# Patient Record
Sex: Female | Born: 1980 | Race: Black or African American | Hispanic: No | Marital: Married | Smoking: Current every day smoker
Health system: Southern US, Community
[De-identification: ages and names within clinical notes are randomized; demographics above are authoritative.]

## PROBLEM LIST (undated history)

## (undated) DIAGNOSIS — J40 Bronchitis, not specified as acute or chronic: Secondary | ICD-10-CM

## (undated) DIAGNOSIS — J45909 Unspecified asthma, uncomplicated: Secondary | ICD-10-CM

---

## 2003-01-27 ENCOUNTER — Emergency Department (HOSPITAL_COMMUNITY): Admission: EM | Admit: 2003-01-27 | Discharge: 2003-01-27 | Payer: Self-pay | Admitting: Emergency Medicine

## 2006-12-11 ENCOUNTER — Emergency Department (HOSPITAL_COMMUNITY): Admission: EM | Admit: 2006-12-11 | Discharge: 2006-12-11 | Payer: Self-pay | Admitting: Emergency Medicine

## 2007-06-15 ENCOUNTER — Emergency Department (HOSPITAL_COMMUNITY): Admission: EM | Admit: 2007-06-15 | Discharge: 2007-06-15 | Payer: Self-pay | Admitting: Emergency Medicine

## 2010-06-21 ENCOUNTER — Emergency Department (HOSPITAL_COMMUNITY): Payer: BC Managed Care – PPO

## 2010-06-21 ENCOUNTER — Emergency Department (HOSPITAL_COMMUNITY)
Admission: EM | Admit: 2010-06-21 | Discharge: 2010-06-21 | Disposition: A | Discharge status: Home / Self Care | Payer: BC Managed Care – PPO | Attending: Emergency Medicine | Admitting: Emergency Medicine

## 2010-06-21 DIAGNOSIS — R112 Nausea with vomiting, unspecified: Secondary | ICD-10-CM | POA: Insufficient documentation

## 2010-06-21 LAB — URINALYSIS, ROUTINE W REFLEX MICROSCOPIC
Glucose, UA: NEGATIVE mg/dL
Ketones, ur: 15 mg/dL — AB
Nitrite: NEGATIVE
Specific Gravity, Urine: 1.022 (ref 1.005–1.030)
pH: 7.5 (ref 5.0–8.0)

## 2010-06-21 LAB — POCT I-STAT, CHEM 8
BUN: 4 mg/dL — ABNORMAL LOW (ref 6–23)
Chloride: 104 mEq/L (ref 96–112)
Creatinine, Ser: 0.7 mg/dL (ref 0.4–1.2)
Sodium: 142 mEq/L (ref 135–145)
TCO2: 25 mmol/L (ref 0–100)

## 2010-06-21 LAB — PREGNANCY, URINE: Preg Test, Ur: NEGATIVE

## 2010-06-21 LAB — URINE MICROSCOPIC-ADD ON

## 2010-10-27 LAB — DIFFERENTIAL
Basophils Absolute: 0.1
Basophils Relative: 1
Eosinophils Absolute: 0.5
Eosinophils Relative: 9 — ABNORMAL HIGH
Lymphocytes Relative: 45
Lymphs Abs: 2.6
Monocytes Absolute: 0.5
Monocytes Relative: 9
Neutro Abs: 2.2
Neutrophils Relative %: 38 — ABNORMAL LOW

## 2010-10-27 LAB — I-STAT 8, (EC8 V) (CONVERTED LAB)
BUN: 6
Bicarbonate: 24.4 — ABNORMAL HIGH
HCT: 40
Operator id: 151321
Sodium: 141
TCO2: 26
pCO2, Ven: 48.7

## 2010-10-27 LAB — CBC
HCT: 36.5
Hemoglobin: 12.4
MCHC: 34
MCV: 86.6
Platelets: 264
RBC: 4.21
RDW: 14.3
WBC: 5.9

## 2010-10-27 LAB — POCT I-STAT CREATININE
Creatinine, Ser: 0.8
Operator id: 151321

## 2013-06-15 ENCOUNTER — Encounter (HOSPITAL_COMMUNITY): Payer: Self-pay | Admitting: Emergency Medicine

## 2013-06-15 ENCOUNTER — Emergency Department (HOSPITAL_COMMUNITY)
Admission: EM | Admit: 2013-06-15 | Discharge: 2013-06-15 | Disposition: A | Discharge status: Home / Self Care | Payer: BC Managed Care – PPO | Attending: Emergency Medicine | Admitting: Emergency Medicine

## 2013-06-15 DIAGNOSIS — B3731 Acute candidiasis of vulva and vagina: Secondary | ICD-10-CM | POA: Insufficient documentation

## 2013-06-15 DIAGNOSIS — N898 Other specified noninflammatory disorders of vagina: Secondary | ICD-10-CM

## 2013-06-15 DIAGNOSIS — B373 Candidiasis of vulva and vagina: Secondary | ICD-10-CM

## 2013-06-15 DIAGNOSIS — F17200 Nicotine dependence, unspecified, uncomplicated: Secondary | ICD-10-CM | POA: Insufficient documentation

## 2013-06-15 DIAGNOSIS — Z3202 Encounter for pregnancy test, result negative: Secondary | ICD-10-CM | POA: Insufficient documentation

## 2013-06-15 LAB — URINALYSIS, ROUTINE W REFLEX MICROSCOPIC
BILIRUBIN URINE: NEGATIVE
Glucose, UA: NEGATIVE mg/dL
Hgb urine dipstick: NEGATIVE
Ketones, ur: NEGATIVE mg/dL
Leukocytes, UA: NEGATIVE
NITRITE: NEGATIVE
PROTEIN: NEGATIVE mg/dL
SPECIFIC GRAVITY, URINE: 1.033 — AB (ref 1.005–1.030)
UROBILINOGEN UA: 1 mg/dL (ref 0.0–1.0)
pH: 6 (ref 5.0–8.0)

## 2013-06-15 LAB — WET PREP, GENITAL
CLUE CELLS WET PREP: NONE SEEN
Trich, Wet Prep: NONE SEEN

## 2013-06-15 LAB — PREGNANCY, URINE: PREG TEST UR: NEGATIVE

## 2013-06-15 LAB — RPR

## 2013-06-15 LAB — HIV ANTIBODY (ROUTINE TESTING W REFLEX): HIV 1&2 Ab, 4th Generation: NONREACTIVE

## 2013-06-15 MED ORDER — AZITHROMYCIN 250 MG PO TABS
1000.0000 mg | ORAL_TABLET | Freq: Once | ORAL | Status: AC
  Administered 2013-06-15: 1000 mg via ORAL
  Filled 2013-06-15: qty 4

## 2013-06-15 MED ORDER — FLUCONAZOLE 150 MG PO TABS: 150.0000 mg | ORAL_TABLET | Freq: Once | ORAL | Status: DC

## 2013-06-15 MED ORDER — CEFTRIAXONE SODIUM 250 MG IJ SOLR
250.0000 mg | Freq: Once | INTRAMUSCULAR | Status: AC
  Administered 2013-06-15: 250 mg via INTRAMUSCULAR
  Filled 2013-06-15: qty 250

## 2013-06-15 MED ORDER — LIDOCAINE HCL (PF) 1 % IJ SOLN
INTRAMUSCULAR | Status: AC
  Administered 2013-06-15: 5 mL
  Filled 2013-06-15: qty 5

## 2013-06-15 NOTE — Discharge Instructions (Signed)
Sexually Transmitted Disease A sexually transmitted disease (STD) is a disease or infection that may be passed (transmitted) from person to person, usually during sexual activity. This may happen by way of saliva, semen, blood, vaginal mucus, or urine. Common STDs include:   Gonorrhea.   Chlamydia.   Syphilis.   HIV and AIDS.   Genital herpes.   Hepatitis B and C.   Trichomonas.   Human papillomavirus (HPV).   Pubic lice.   Scabies.  Mites.  Bacterial vaginosis. WHAT ARE CAUSES OF STDs? An STD may be caused by bacteria, a virus, or parasites. STDs are often transmitted during sexual activity if one person is infected. However, they may also be transmitted through nonsexual means. STDs may be transmitted after:   Sexual intercourse with an infected person.   Sharing sex toys with an infected person.   Sharing needles with an infected person or using unclean piercing or tattoo needles.  Having intimate contact with the genitals, mouth, or rectal areas of an infected person.   Exposure to infected fluids during birth. WHAT ARE THE SIGNS AND SYMPTOMS OF STDs? Different STDs have different symptoms. Some people may not have any symptoms. If symptoms are present, they may include:   Painful or bloody urination.   Pain in the pelvis, abdomen, vagina, anus, throat, or eyes.   Skin rash, itching, irritation, growths, sores (lesions), ulcerations, or warts in the genital or anal area.  Abnormal vaginal discharge with or without bad odor.   Penile discharge in men.   Fever.   Pain or bleeding during sexual intercourse.   Swollen glands in the groin area.   Yellow skin and eyes (jaundice). This is seen with hepatitis.   Swollen testicles.  Infertility.  Sores and blisters in the mouth. HOW ARE STDs DIAGNOSED? To make a diagnosis, your health care provider may:   Take a medical history.   Perform a physical exam.   Take a sample of any  discharge for examination.  Swab the throat, cervix, opening to the penis, rectum, or vagina for testing.  Test a sample of your first morning urine.   Perform blood tests.   Perform a Pap smear, if this applies.   Perform a colposcopy.   Perform a laparoscopy.  HOW ARE STDs TREATED? Treatment depends on the STD. Some STDs may be treated but not cured.   Chlamydia, gonorrhea, trichomonas, and syphilis can be cured with antibiotics.   Genital herpes, hepatitis, and HIV can be treated, but not cured, with prescribed medicines. The medicines lessen symptoms.   Genital warts from HPV can be treated with medicine or by freezing, burning (electrocautery), or surgery. Warts may come back.   HPV cannot be cured with medicine or surgery. However, abnormal areas may be removed from the cervix, vagina, or vulva.   If your diagnosis is confirmed, your recent sexual partners need treatment. This is true even if they are symptom-free or have a negative culture or evaluation. They should not have sex until their health care providers say it is OK. HOW CAN I REDUCE MY RISK OF GETTING AN STD?  Use latex condoms, dental dams, and water-soluble lubricants during sexual activity. Do not use petroleum jelly or oils.  Get vaccinated for HPV and hepatitis. If you have not received these vaccines in the past, talk to your health care provider about whether one or both might be right for you.   Avoid risky sex practices that can break the skin.  WHAT SHOULD   I DO IF I THINK I HAVE AN STD?  See your health care provider.   Inform all sexual partners. They should be tested and treated for any STDs.  Do not have sex until your health care provider says it is OK. WHEN SHOULD I GET HELP? Seek immediate medical care if:  You develop severe abdominal pain.  You are a man and notice swelling or pain in the testicles.  You are a woman and notice swelling or pain in your vagina. Document  Released: 03/27/2002 Document Revised: 10/25/2012 Document Reviewed: 07/25/2012 [employer] Patient Information 2014 [employer].  

## 2013-06-15 NOTE — ED Provider Notes (Signed)
Medical screening examination/treatment/procedure(s) were performed by non-physician practitioner and as supervising physician I was immediately available for consultation/collaboration.  Flint Melter, MD 06/15/13 2207

## 2013-06-15 NOTE — ED Provider Notes (Signed)
CSN: [id number]     Arrival date & time 06/15/13  1027 History   First MD Initiated Contact with Patient 06/15/13 1233     Chief Complaint  Patient presents with  . Vaginal Discharge     (Consider location/radiation/quality/duration/timing/severity/associated sxs/prior Treatment) HPI  33 year old female with hx of chlamydia infection presents c/o vaginal discharge.  Patient reports she was diagnosed with having immediate infection month ago. She was given Zithromax however after taking the medication she vomited up shortly. She did not take any additional antibiotic. Since then she has endorse vaginal discharge. Her discharge has been persistent. She had a menstrual period last week and after that her discharge has increased. She attempted to follow up with OB/GYN but her next appointment is 2 months from now. She decided to come to ER for further evaluation. She denies any fever, chills, productive cough, nausea vomiting diarrhea, abdominal pain, vaginal bleeding, dysuria but does endorse urinary frequency. She has not been sexually active since her diagnosis. She is not allergic to any medication.  History reviewed. No pertinent past medical history. History reviewed. No pertinent past surgical history. History reviewed. No pertinent family history. History  Substance Use Topics  . Smoking status: Current Every Day Smoker  . Smokeless tobacco: Not on file  . Alcohol Use: Not on file   OB History   Grav Para Term Preterm Abortions TAB SAB Ect Mult Living                 Review of Systems  All other systems reviewed and are negative.     Allergies  Review of patient's allergies indicates no known allergies.  Home Medications   Prior to Admission medications   Medication Sig Start Date End Date Taking? Authorizing Provider  ibuprofen (ADVIL,MOTRIN) 400 MG tablet Take 400 mg by mouth every 6 (six) hours as needed for cramping.   Yes Historical Provider, MD   BP 123/69  Pulse  76  Temp(Src) 98.8 F (37.1 C) (Oral)  Resp 20  Wt 165 lb (74.844 kg)  SpO2 100%  LMP 06/03/2013 Physical Exam  Nursing note and vitals reviewed. Constitutional: She appears well-developed and well-nourished. No distress.  HENT:  Head: Normocephalic and atraumatic.  Eyes: Conjunctivae are normal.  Neck: Normal range of motion. Neck supple.  Cardiovascular: Normal rate and regular rhythm.   Pulmonary/Chest: Effort normal and breath sounds normal. She exhibits no tenderness.  Abdominal: Soft. There is no tenderness.  Genitourinary: Vagina normal and uterus normal. There is no rash or lesion on the right labia. There is no rash or lesion on the left labia. Cervix exhibits no motion tenderness and no discharge. Right adnexum displays no mass and no tenderness. Left adnexum displays no mass and no tenderness. No erythema, tenderness or bleeding around the vagina. No vaginal discharge found.  Chaperone present:  Lymphadenopathy:       Right: No inguinal adenopathy present.       Left: No inguinal adenopathy present.    ED Course  Procedures (including critical care time)  12:52 PM Was diagnosed with chlamydia infection a month ago, was given zithromax but vomited up her medication and now here with persistent vaginal discharge along with urinary frequency.  Work up initiated.    2:41 PM Minimal yeast.  Otherwise wet prep unremarkable.  UA without UTI.  Diflucan given.  Rocephin/zithromax given.  Pt stable for discharge.    Labs Review Labs Reviewed  WET PREP, GENITAL - Abnormal; Notable for  the following:    Yeast Wet Prep HPF POC FEW (*)    WBC, Wet Prep HPF POC FEW (*)    All other components within normal limits  URINALYSIS, ROUTINE W REFLEX MICROSCOPIC - Abnormal; Notable for the following:    Specific Gravity, Urine 1.033 (*)    All other components within normal limits  GC/CHLAMYDIA PROBE AMP  PREGNANCY, URINE  RPR  HIV ANTIBODY (ROUTINE TESTING)    Imaging Review No  results found.   EKG Interpretation None      MDM   Final diagnoses:  Vaginal discharge  Vagina, candidiasis    BP 123/69  Pulse 76  Temp(Src) 98.8 F (37.1 C) (Oral)  Resp 20  Wt 165 lb (74.844 kg)  SpO2 100%  LMP 06/03/2013     Fayrene HelperBowie Alonte Wulff, PA-C 06/15/13 1443

## 2013-06-15 NOTE — ED Notes (Signed)
Pt in c/o vaginal discharge x1 week with lower back pain, states she was treated for chlamydia a few months ago but was unable to complete the antibiotics due to them causing vomiting, states symptoms feel the same

## 2013-06-16 LAB — GC/CHLAMYDIA PROBE AMP
CT Probe RNA: NEGATIVE
GC Probe RNA: NEGATIVE

## 2013-08-14 ENCOUNTER — Emergency Department (INDEPENDENT_AMBULATORY_CARE_PROVIDER_SITE_OTHER)
Admission: EM | Admit: 2013-08-14 | Discharge: 2013-08-14 | Disposition: A | Discharge status: Home / Self Care | Payer: Managed Care, Other (non HMO) | Source: Home / Self Care | Attending: Emergency Medicine | Admitting: Emergency Medicine

## 2013-08-14 ENCOUNTER — Encounter (HOSPITAL_COMMUNITY): Payer: Self-pay | Admitting: Emergency Medicine

## 2013-08-14 ENCOUNTER — Other Ambulatory Visit (HOSPITAL_COMMUNITY)
Admission: RE | Admit: 2013-08-14 | Discharge: 2013-08-14 | Disposition: A | Discharge status: Home / Self Care | Payer: Managed Care, Other (non HMO) | Source: Ambulatory Visit | Attending: Emergency Medicine | Admitting: Emergency Medicine

## 2013-08-14 DIAGNOSIS — Z113 Encounter for screening for infections with a predominantly sexual mode of transmission: Secondary | ICD-10-CM | POA: Insufficient documentation

## 2013-08-14 DIAGNOSIS — N72 Inflammatory disease of cervix uteri: Secondary | ICD-10-CM

## 2013-08-14 DIAGNOSIS — N760 Acute vaginitis: Secondary | ICD-10-CM | POA: Insufficient documentation

## 2013-08-14 LAB — POCT URINALYSIS DIP (DEVICE)
Bilirubin Urine: NEGATIVE
Glucose, UA: NEGATIVE mg/dL
Hgb urine dipstick: NEGATIVE
KETONES UR: NEGATIVE mg/dL
Leukocytes, UA: NEGATIVE
Nitrite: NEGATIVE
PH: 6 (ref 5.0–8.0)
PROTEIN: NEGATIVE mg/dL
UROBILINOGEN UA: 0.2 mg/dL (ref 0.0–1.0)

## 2013-08-14 LAB — POCT PREGNANCY, URINE: Preg Test, Ur: NEGATIVE

## 2013-08-14 MED ORDER — DOXYCYCLINE HYCLATE 100 MG PO CAPS: 100.0000 mg | ORAL_CAPSULE | Freq: Two times a day (BID) | ORAL | Status: AC

## 2013-08-14 MED ORDER — CEFTRIAXONE SODIUM 250 MG IJ SOLR
250.0000 mg | Freq: Once | INTRAMUSCULAR | Status: AC
  Administered 2013-08-14: 250 mg via INTRAMUSCULAR

## 2013-08-14 MED ORDER — CEFTRIAXONE SODIUM 250 MG IJ SOLR
INTRAMUSCULAR | Status: AC
  Filled 2013-08-14: qty 250

## 2013-08-14 MED ORDER — FLUCONAZOLE 150 MG PO TABS: 150.0000 mg | ORAL_TABLET | Freq: Every day | ORAL | Status: DC

## 2013-08-14 NOTE — Discharge Instructions (Signed)
We are going to treat your presumptively for an infection. Take doxycycline 100mg  twice a day for 14 days. Once you finish the doxycycline, take the diflucan for yeast.  Someone will call you if any of your testing comes back positive.  No news is good news. Follow up if your symptoms change or worsen.

## 2013-08-14 NOTE — ED Provider Notes (Signed)
CSN: [id number]     Arrival date & time 08/14/13  1628 History   First MD Initiated Contact with Patient 08/14/13 1716     Chief Complaint  Patient presents with  . Abdominal Pain   (Consider location/radiation/quality/duration/timing/severity/associated sxs/prior Treatment) HPI She is here today for evaluation of vaginal discharge. She states she had a prolonged period, that lasted 14 days. This resolved a few days ago, but since then she has had vaginal discharge. It is thick and white with a foul odor. She denies any itching or vaginal pain. She does describe some lower abdominal discomfort. No fevers or chills. No nausea or vomiting. No diarrhea, no dysuria. She does state that she recently found out her husband cheated on her. She does not know when the infidelity occurred. She would like STD testing.  History reviewed. No pertinent past medical history. History reviewed. No pertinent past surgical history. History reviewed. No pertinent family history. History  Substance Use Topics  . Smoking status: Current Every Day Smoker  . Smokeless tobacco: Not on file  . Alcohol Use: Not on file   OB History   Grav Para Term Preterm Abortions TAB SAB Ect Mult Living                 Review of Systems  Constitutional: Negative.   Gastrointestinal: Positive for abdominal pain and constipation. Negative for nausea, vomiting and diarrhea.  Genitourinary: Positive for vaginal discharge. Negative for dysuria and urgency.  Neurological: Negative.     Allergies  Review of patient's allergies indicates no known allergies.  Home Medications   Prior to Admission medications   Medication Sig Start Date End Date Taking? Authorizing Provider  doxycycline (VIBRAMYCIN) 100 MG capsule Take 1 capsule (100 mg total) by mouth 2 (two) times daily. 08/14/13   Charm RingsErin J Jnai Snellgrove, MD  fluconazole (DIFLUCAN) 150 MG tablet Take 1 tablet (150 mg total) by mouth daily. Take after finishing doxycycline 08/14/13   Charm RingsErin  J Rupert Azzara, MD   BP 119/63  Pulse 76  Temp(Src) 99 F (37.2 C) (Oral)  Resp 14  SpO2 99%  LMP 08/08/2013 Physical Exam  Constitutional: She appears well-developed and well-nourished. No distress.  Abdominal: Soft. Bowel sounds are normal.  Genitourinary: There is no rash on the right labia. There is no rash on the left labia. Uterus is tender. Cervix exhibits motion tenderness. Cervix exhibits no discharge. Right adnexum displays tenderness (mild). Right adnexum displays no mass. Left adnexum displays no mass and no tenderness. No bleeding around the vagina. Vaginal discharge (thick white) found.    ED Course  Procedures (including critical care time) Labs Review Labs Reviewed  HIV ANTIBODY (ROUTINE TESTING)  RPR  POCT URINALYSIS DIP (DEVICE)  POCT PREGNANCY, URINE  CERVICOVAGINAL ANCILLARY ONLY  CERVICOVAGINAL ANCILLARY ONLY    Imaging Review No results found.   MDM   1. Cervicitis    Discharge is consistent with a yeast infection. Given history of spousal infidelity and cervical motion tenderness, will treat presumptively for gonorrhea and Chlamydia. Testing for HIV and RPR also collected today. Ceftriaxone 250 mg IM given today. Prescription for doxycycline 100 mg twice a day for 14 days provided. Diflucan 150 mg by mouth x1 given to take after completion of antibiotics. Followup as needed.   Charm RingsErin J Mickle Campton, MD 08/14/13 [phone]

## 2013-08-14 NOTE — ED Notes (Signed)
Pt  Reports  Low  abd  Pain     With  Vaginal  Discharge       Since  Last  Week            Pt  Also  Reports  Had  A  Prolonged  Period   Prior  To  The  Last  One       She  Is  Ambulatory  With a  steady  Fluid  Gait

## 2013-08-17 ENCOUNTER — Telehealth (HOSPITAL_COMMUNITY): Payer: Self-pay | Admitting: *Deleted

## 2013-08-17 MED ORDER — METRONIDAZOLE 500 MG PO TABS: 500.0000 mg | ORAL_TABLET | Freq: Two times a day (BID) | ORAL | Status: DC

## 2013-08-17 NOTE — ED Notes (Signed)
GC/Chlamydia neg., Affirm: Candida and Trich neg., Gardnerella pos.  7/29  Message to Dr. Piedad ClimesHonig.  7/31  She put in order for Flagyl, but no pharmacy listed. I called pt. And left a message to call.  Call 1. Shari Johns, Shari Johns M 08/17/2013

## 2013-08-19 NOTE — ED Notes (Signed)
Pt. called back.  Pt. verified x 2 and given results.  Pt. said she still has symptoms. Pt. told she needs Flagyl for bacterial vaginosis.  Pt. wants Rx. called to [facility] on [address].   Pt. instructed to no alcohol while taking this medication.  Pt. voiced understanding.  Pt. asked if she needs to finish all of the Doxycycline, since she did not have an STD. I told her I would send a message to the doctor and call her back. Vassie MoselleYork, Mairyn Lenahan M 08/19/2013

## 2013-08-21 NOTE — ED Notes (Signed)
Message from Dr. Piedad ClimesHonig that pt. Can stop the Doxycycline. I called pt. And left this message on her VM. I asked her to call me back, so I know she got my message.  Call 1. Vassie MoselleYork, Sherian Valenza M 08/21/2013

## 2013-10-12 ENCOUNTER — Encounter (HOSPITAL_COMMUNITY): Payer: Self-pay | Admitting: Emergency Medicine

## 2013-10-12 ENCOUNTER — Emergency Department (HOSPITAL_COMMUNITY)
Admission: EM | Admit: 2013-10-12 | Discharge: 2013-10-12 | Disposition: A | Discharge status: Home / Self Care | Payer: Managed Care, Other (non HMO) | Attending: Emergency Medicine | Admitting: Emergency Medicine

## 2013-10-12 ENCOUNTER — Emergency Department (HOSPITAL_COMMUNITY): Payer: Managed Care, Other (non HMO)

## 2013-10-12 DIAGNOSIS — F17200 Nicotine dependence, unspecified, uncomplicated: Secondary | ICD-10-CM | POA: Insufficient documentation

## 2013-10-12 DIAGNOSIS — Z3202 Encounter for pregnancy test, result negative: Secondary | ICD-10-CM | POA: Insufficient documentation

## 2013-10-12 DIAGNOSIS — S199XXA Unspecified injury of neck, initial encounter: Secondary | ICD-10-CM

## 2013-10-12 DIAGNOSIS — S0990XA Unspecified injury of head, initial encounter: Secondary | ICD-10-CM | POA: Diagnosis not present

## 2013-10-12 DIAGNOSIS — S46909A Unspecified injury of unspecified muscle, fascia and tendon at shoulder and upper arm level, unspecified arm, initial encounter: Secondary | ICD-10-CM | POA: Insufficient documentation

## 2013-10-12 DIAGNOSIS — S0993XA Unspecified injury of face, initial encounter: Secondary | ICD-10-CM | POA: Diagnosis not present

## 2013-10-12 DIAGNOSIS — M25511 Pain in right shoulder: Secondary | ICD-10-CM

## 2013-10-12 DIAGNOSIS — M79605 Pain in left leg: Secondary | ICD-10-CM

## 2013-10-12 DIAGNOSIS — Z792 Long term (current) use of antibiotics: Secondary | ICD-10-CM | POA: Insufficient documentation

## 2013-10-12 DIAGNOSIS — Y92410 Unspecified street and highway as the place of occurrence of the external cause: Secondary | ICD-10-CM | POA: Diagnosis not present

## 2013-10-12 DIAGNOSIS — S8990XA Unspecified injury of unspecified lower leg, initial encounter: Secondary | ICD-10-CM | POA: Insufficient documentation

## 2013-10-12 DIAGNOSIS — S99929A Unspecified injury of unspecified foot, initial encounter: Secondary | ICD-10-CM

## 2013-10-12 DIAGNOSIS — J45909 Unspecified asthma, uncomplicated: Secondary | ICD-10-CM | POA: Diagnosis not present

## 2013-10-12 DIAGNOSIS — Y9389 Activity, other specified: Secondary | ICD-10-CM | POA: Insufficient documentation

## 2013-10-12 DIAGNOSIS — S4980XA Other specified injuries of shoulder and upper arm, unspecified arm, initial encounter: Secondary | ICD-10-CM | POA: Diagnosis not present

## 2013-10-12 DIAGNOSIS — S99919A Unspecified injury of unspecified ankle, initial encounter: Secondary | ICD-10-CM

## 2013-10-12 HISTORY — DX: Bronchitis, not specified as acute or chronic: J40

## 2013-10-12 HISTORY — DX: Unspecified asthma, uncomplicated: J45.909

## 2013-10-12 LAB — POC URINE PREG, ED: Preg Test, Ur: NEGATIVE

## 2013-10-12 MED ORDER — OXYCODONE-ACETAMINOPHEN 5-325 MG PO TABS
1.0000 | ORAL_TABLET | Freq: Once | ORAL | Status: AC
  Administered 2013-10-12: 1 via ORAL
  Filled 2013-10-12: qty 1

## 2013-10-12 MED ORDER — DIAZEPAM 5 MG PO TABS
5.0000 mg | ORAL_TABLET | Freq: Once | ORAL | Status: AC
  Administered 2013-10-12: 5 mg via ORAL
  Filled 2013-10-12: qty 1

## 2013-10-12 MED ORDER — CYCLOBENZAPRINE HCL 10 MG PO TABS: 10.0000 mg | ORAL_TABLET | Freq: Two times a day (BID) | ORAL | Status: AC | PRN

## 2013-10-12 MED ORDER — IBUPROFEN 200 MG PO TABS
400.0000 mg | ORAL_TABLET | Freq: Once | ORAL | Status: AC
  Administered 2013-10-12: 400 mg via ORAL
  Filled 2013-10-12: qty 2
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
## 2013-10-12 NOTE — Discharge Instructions (Signed)
Return to the emergency room with worsening of symptoms, new symptoms or with symptoms that are concerning, especially severe pain, fevers, swelling, warmth or redness of shoulder or right knee. Call to make appointment for follow up with Urgent care in next week.  RICE: Rest, Ice (three cycles of 20 mins on, off at least twice a day), compression/brace, elevation. Heating pad works well for back pain. Ibuprofen  (2 tablets ) every 5-6 hours for 3-5 days and then as needed for pain. Follow up with PCP if symptoms worsen or are persistent. Make sure to walk on your leg and to move your shoulder to prevent getting adhesive capsulitis.  Please call your doctor for a followup appointment within 24-48 hours. When you talk to your doctor please let them know that you were seen in the emergency department and have them acquire all of your records so that they can discuss the findings with you and formulate a treatment plan to fully care for your new and ongoing problems. If you do not have a primary care provider please call the number below under ED resources to establish care with a provider and follow up.    Emergency Department Resource Guide 1) Find a Doctor and Pay Out of Pocket Although you won't have to find out who is covered by your insurance plan, it is a good idea to ask around and get recommendations. You will then need to call the office and see if the doctor you have chosen will accept you as a new patient and what types of options they offer for patients who are self-pay. Some doctors offer discounts or will set up payment plans for their patients who do not have insurance, but you will need to ask so you aren't surprised when you get to your appointment.  2) Contact Your Local Health Department Not all health departments have doctors that can see patients for sick visits, but many do, so it is worth a call to see if yours does. If you don't know where your local health  department is, you can check in your phone book. The CDC also has a tool to help you locate your state's health department, and many state websites also have listings of all of their local health departments.  3) Find a Walk-in Clinic If your illness is not likely to be very severe or complicated, you may want to try a walk in clinic. These are popping up all over the country in pharmacies, drugstores, and shopping centers. They're usually staffed by nurse practitioners or physician assistants that have been trained to treat common illnesses and complaints. They're usually fairly quick and inexpensive. However, if you have serious medical issues or chronic medical problems, these are probably not your best option.  No Primary Care Doctor: - Call Health Connect at  [phone] - they can help you locate a primary care doctor that  accepts your insurance, provides certain services, etc. - Physician Referral Service- [phone]  Chronic Pain Problems: Organization         Address  Phone   Notes  [facility]  [phone] Patients need to be referred by their primary care doctor.   Medication Assistance: Organization         Address  Phone   Notes  [facility] Medication [facility] [address] [place] [place], [place] [address] [phone] --Must be a resident of [facility] -- Must have NO insurance coverage whatsoever (no Medicaid/  Medicare, etc.) -- The pt. MUST have a primary care doctor that directs their care regularly and follows them in the community   [facility]  [phone]   [employer]  [phone]    Agencies that provide inexpensive medical care: Organization         Address  Phone   Notes  [facility]  [phone]   [facility]    [phone]   [facility] [address] [place], [place] [address] [phone]   [facility] [place] [address], [place] [phone]   [facility]anned [facility]    [phone]) [phone]   [facility]    [phone]) [phone]   [facility]  [address], [place] Phone:  [phone]) [phone], Fax:  [phone]) [phone] Hours of Operation:  9 am - 6 pm, M-F.  Also accepts Medicaid/Medicare and self-pay.  [facility]  [address], [place] Phone: [phone], Fax: [phone]. Hours of Operation:  8:30 am - 5:30 pm, M-F.  Also accepts Medicaid and self-pay.  [facility] [place] [address], [place] Phone: [phone]) [phone]   [facility] [address], [place], [place] [phone], Ext. [phone] Mondays & Thursdays: 7-9 AM.  First 15 patients are seen on a first come, first serve basis.    Medicaid-accepting [facility] Providers:  Organization         Address  Phone   Notes  [facility] [address], [place] [phone] Also accepts self-pay patients.  [facility] [address] [place], [place]  [phone]   [facility] [address], [place] [phone]) [phone]   [facility] [address] [place] [address], [place] [phone]   Renaye RakersVeita Bland [address], [place]   [phone] Only accepts [place] Access [place] patients after they have their name applied to their card.   Self-Pay (no insurance) in [facility]:  Organization         Address  Phone   Notes  Sickle Cell Patients, [facility] [address], [place] [phone]) [phone]   [facility] [address], [place] [phone]   [facility] [place]  [address], [place] [phone]   [facility]/Dr. Osei-Bonsu  [address] [place] [address], [place] or [phone], [place] [phone] Phone number for both [place] and  [place] locations is the same.  [facility] [address], [place] [phone]   [facility] [place] [address] [place] [address], [place] or [address] [phone]   [facility] [address], [place] [phone], phone; [phone]) [phone], fax Sees patients 1st and 3rd Saturday of every month.  Must not qualify for public or private insurance (i.e. Medicaid, Medicare, [place] Health Choice, Veterans' Benefits)  Household income should be no more than 200% of the poverty level The clinic cannot treat you if you are pregnant or think you are pregnant  Sexually transmitted diseases are not treated at the clinic.    Dental Care: Organization         Address  Phone  Notes  [facility] Department of [facility] [address] [place], [place] [phone] Accepts children up to age 84 who are enrolled in [place] or [place] Health Choice; pregnant women with a Medicaid card; and children who have applied for Medicaid or [place] Health Choice, but were declined, whose parents can pay a reduced fee at time of service.  [facility]  [address], [place] [phone] Accepts children up to age 82 who are enrolled in [place] or [place] Health Choice; pregnant women with a Medicaid card; and children who have applied for Medicaid or [place] Health Choice, but were declined, whose parents can pay a reduced fee at time of service.  [facility]  [address] [place], [place] [phone] Patients are seen by appointment only. Walk-ins are not accepted. [facility] will see patients 30 years of age and older. Monday - Tuesday (8am-5pm) Most Wednesdays (8:30-5pm) $30 per visit, cash only  [facility]  [address], [facility] [phone] Patients are seen by appointment only. Walk-ins are not accepted.  [facility] will see patients 17 years of age and older. One Wednesday Evening (Monthly: Volunteer Based).  $30 per visit, cash only  [employer]  [phone] for adults; Children under age 50, call Graduate Pediatric Dentistry at [phone]. Children aged 28-14, please call [phone] to request a pediatric application.  Dental services are provided in all areas of dental care including fillings, crowns and bridges, complete and partial dentures, implants, gum treatment, root canals, and extractions. Preventive care is also provided. Treatment is provided to both adults and children. Patients are selected via a lottery and there is often a waiting list.   [facility] [address] [place]  [phone] [email or web address]   [facility] [address] [place], [place] [phone], Ext. [phone] Second and Fourth Thursday of each month, opens at 6:30 AM; Clinic ends at 9 AM.  Patients are seen on a first-come first-served basis, and a limited number are seen during each clinic.   [facility]  [address] [place], [place] [phone]   Eligibility Requirements You must have lived in [place], [place], or [place] counties for at least the last three months.   You cannot be eligible for state or federal sponsored [place], including [employer], [place], or [employer].   You generally cannot be eligible for healthcare insurance through your employer.    How to apply: Eligibility screenings are held every Tuesday and Wednesday afternoon from 1:00 pm until 4:00 pm. You do not need an appointment for the interview!  [facility] [address], [place], [place] [phone]   [facility] Health Department  [phone]   [facility] Health Department  [phone]   [facility] Health Department  [phone]    Behavioral Health Resources in the  Community: Intensive Outpatient Programs Organization         Address  Phone  Notes  [facility] Services [address] [place], [place] [phone]   [facility]patient [address] [place], [place] [phone]   [facility] [address], [place], [place]  [phone]   [facility] [address]  [place], [place] [phone] or [phone]   Substance Abuse Resources Organization         Address  Phone  Notes  [facility] Drug Services  [phone]   [facility]  [phone]   [facility] [place]  [phone]   Floydene Flock  [phone]   Residential & Outpatient Substance Abuse Program  [phone]   Psychological Services Organization         Address  Phone  Notes  [facility] Health  [phone]   [facility] Services  [phone]   [facility] [address] [place] [phone] or [phone]    Mobile Crisis Teams Organization         Address  Phone  Notes  [facility], Mobile Crisis Care Unit  [phone]   [facility]  [address] [place], [place] [phone]   Doristine Locks [address] [place] [phone]    Self-Help/Support Groups Organization         Address  Phone             Notes  [facility] [place] - variety of support groups  [phone]- [phone] Call for more information  Narcotics Anonymous (NA), Caring Services [address], [employer] [place]  2 meetings at this location   Statistician         Address  Phone  Notes  [facility] [address],    [place]  [phone]   [facility]  [address], [place] [address], [place], [place] [phone]   [facility] [address] [place], [place] [phone] Admissions: 8am-3pm M-F  [facility] [address],    [place], [place] [phone]   [facility] [address] [place], [place], [place] [phone]   [facility] [address]  [place], [place] [phone]   [facility] - Intensive Outpatient [address], [place], [place] [phone]   [facility] ([facility] [address] [place].,  [place], [place] [phone] or [phone]   [facility] (RT[facility]) [address], [place], [place] [phone] Accepts Medicaid  [facility] [place] [address],  [place] [phone] Substance Abuse/Addiction Treatment   [facility] Organization         Address  Phone  Notes  [facility]  [phone]   Angie Fava, PhD [address] [place], [place]   [phone] or [phone]   [facility] Behavioral   [address] [place], [place] [phone]   [facility] [address] [place], [place] [phone] Insurance/Medicaid/sponsorship through [facility] and Families [address]                                    [place], [place] [phone] Therapy/tele-psych/case  [facility] [address], [place] [phone]    Dr. Lolly Mustache  [phone]   [facility] [place]  [employer] [facility] Dept. 1) [address] [place] 2) [address], [place] 3)  [address], [place] [phone]  [phone]   [facility] Child Abuse Hotline 774-885-4798 or 4124942718 (After Hours)      Adhesive Capsulitis Sometimes the shoulder becomes stiff and is painful to move. Some people say it feels as if the shoulder is frozen in place. Because of this,  the condition is called "frozen shoulder." Its medical name is adhesive capsulitis.  The shoulder joint is made up of strong connective tissue that attaches the ball of the humerus to the shallow shoulder socket. This  strong connective tissue is called the joint capsule. This tissue can become stiff and swollen. That is when adhesive capsulitis sets in. CAUSES  It is not always clear just what the cause adhesive capsulitis. Possibilities include:  Injury to the shoulder joint.  Strain. This is a repetitive injury brought about by overuse.  Lack of use. Perhaps your arm or hand was otherwise injured. It might have been in a sling for awhile. Or perhaps you were not using it to avoid pain.  Referred pain. This is a sort of trick the body plays. You feel pain in the shoulder. But, the pain actually comes from an injury somewhere else in the body.  Long-standing health problems. Several diseases can cause adhesive capsulitis. They include diabetes, heart disease, stroke, thyroid problems, rheumatoid arthritis and lung disease.  Being a women older than 40. Anyone can develop adhesive capsulitis but it is most common in women in this age group. SYMPTOMS   Pain.  It occurs when the arm is moved.  Parts of the shoulder might hurt if they are touched.  Pain is worse at night or when resting.  Soreness. It might not be strong enough to be called pain. But, the shoulder aches.  The shoulder does not move freely.  Muscle spasms.  Trouble sleeping because of shoulder ache or pain. DIAGNOSIS  To decide if you have adhesive capsulitis, your healthcare provider will probably:  Ask about symptoms you have noticed.  Ask about your history of joint pain and anything that might have caused the pain.  Ask about your overall health.  Use hands to feel your shoulder and neck.  Ask you to move your shoulder in specific directions. This may indicate the origin of the pain.  Order imaging tests; pictures of the shoulder. They help pinpoint the source of the problem. An X-Losey might be used. For more detail, an MRI is often used. An MRI details the tendons, muscles and ligaments as well as the joint. TREATMENT   Adhesive capsulitis can be treated several ways. Most treatments can be done in a clinic or in your healthcare provider's office. Be sure to discuss the different options with your caregiver. They include:  Physical therapy. You will work on specific exercises to get your shoulder moving again. The exercises usually involve stretching. A physical therapist (a caregiver with special training) can show you what to do and what not to do. The exercises will need to be done daily.  Medication.  Over-the-counter medicines may relieve pain and inflammation (the body's way of reacting to injury or infection).  Corticosteroids. These are stronger drugs to reduce pain and inflammation. They are given by injection (shots) into the shoulder joint. Frequent treatment is not recommended.  Muscle relaxants. Medication may be prescribed to ease muscle spasms.  Treatment of underlying conditions. This means treating another condition that is causing your shoulder problem. This might be a rotator cuff (tendon) problem  Shoulder manipulation. The shoulder will be moved by your healthcare provider. You would be under general anesthesia (given a drug that puts you to sleep). You would not feel anything. Sometimes the joint will be injected with salt water (saline) at high pressure to break down internal scarring in the joint capsule.  Surgery. This is rarely needed.  It may be suggested in advanced cases after all other treatment has failed. PROGNOSIS  In time, most people recover from adhesive capsulitis. Sometimes, however, the pain goes away but full movement of the shoulder does not return.  HOME CARE INSTRUCTIONS   Take any pain medications recommended by your healthcare provider. Follow the directions carefully.  If you have physical therapy, follow through with the therapist's suggestions. Be sure you understand the exercises you will be doing. You should understand:  How often the exercises should be  done.  How many times each exercise should be repeated.  How long they should be done.  What other activities you should do, or not do.  That you should warm up before doing any exercise. Just 5 to 10 minutes will help. Small, gentle movements should get your shoulder ready for more.  Avoid high-demand exercise that involves your shoulder such as throwing. This type of exercise can make pain worse.  Consider using cold packs. Cold may ease swelling and pain. Ask your healthcare provider if a cold pack might help you. If so, get directions on how and when to use them. SEEK MEDICAL CARE IF:   You have any questions about your medications.  Your pain continues to increase. Document Released: 11/01/2008 Document Revised: 03/29/2011 Document Reviewed: 11/01/2008 [facility] Patient Information 2015 [place], [place]. This information is not intended to replace advice given to you by your health care provider. Make sure you discuss any questions you have with your health care provider.
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
